# Patient Record
Sex: Male | Born: 1990
Health system: Southern US, Community
[De-identification: ages and names within clinical notes are randomized; demographics above are authoritative.]

---

## 2020-03-16 ENCOUNTER — Other Ambulatory Visit: Payer: Self-pay

## 2020-03-16 ENCOUNTER — Emergency Department (HOSPITAL_BASED_OUTPATIENT_CLINIC_OR_DEPARTMENT_OTHER)
Admission: EM | Admit: 2020-03-16 | Discharge: 2020-03-16 | Discharge status: Against Medical Advice | Payer: Self-pay | Attending: Emergency Medicine | Admitting: Emergency Medicine

## 2020-03-16 ENCOUNTER — Other Ambulatory Visit (HOSPITAL_BASED_OUTPATIENT_CLINIC_OR_DEPARTMENT_OTHER): Payer: Self-pay | Admitting: Medical

## 2020-03-16 ENCOUNTER — Encounter (HOSPITAL_BASED_OUTPATIENT_CLINIC_OR_DEPARTMENT_OTHER): Payer: Self-pay | Admitting: *Deleted

## 2020-03-16 ENCOUNTER — Emergency Department (HOSPITAL_BASED_OUTPATIENT_CLINIC_OR_DEPARTMENT_OTHER): Payer: Self-pay

## 2020-03-16 DIAGNOSIS — F17200 Nicotine dependence, unspecified, uncomplicated: Secondary | ICD-10-CM | POA: Insufficient documentation

## 2020-03-16 DIAGNOSIS — S50312A Abrasion of left elbow, initial encounter: Secondary | ICD-10-CM | POA: Insufficient documentation

## 2020-03-16 DIAGNOSIS — S60511A Abrasion of right hand, initial encounter: Secondary | ICD-10-CM | POA: Insufficient documentation

## 2020-03-16 DIAGNOSIS — M25522 Pain in left elbow: Secondary | ICD-10-CM

## 2020-03-16 MED ORDER — DOXYCYCLINE HYCLATE 100 MG PO CAPS: 100.0000 mg | ORAL_CAPSULE | Freq: Two times a day (BID) | ORAL | 0 refills | Status: DC

## 2020-03-16 MED ORDER — DOXYCYCLINE HYCLATE 100 MG PO TABS
100.0000 mg | ORAL_TABLET | Freq: Once | ORAL | Status: AC
  Administered 2020-03-16: 100 mg via ORAL
  Filled 2020-03-16: qty 1
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
## 2020-03-16 NOTE — ED Notes (Signed)
Pt walked out of department  Registration reported that pt was leaving
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
## 2020-03-16 NOTE — Discharge Instructions (Addendum)
At this time there does not appear to be the presence of an emergent medical condition, however there is always the potential for conditions to change. Please read and follow the below instructions.  Please return to the Emergency Department immediately for any new or worsening symptoms. Please be sure to follow up with your Primary Care Provider within one week regarding your visit today; please call their office to schedule an appointment even if you are feeling better for a follow-up visit. Please take your antibiotic Doxycycline as prescribed until complete to help with your likely infection of your right hand.  Please drink enough water to avoid dehydration and get plenty of rest.  Please have that rechecked by your primary care provider in the next 3 days.  Go to the nearest Emergency Department immediately if: You have fever or chills You have a red streak going away from your wound. You have a fever. You have fluid, blood, or pus coming from your wound. There is a bad smell coming from your wound or bandage. You have pain with movement of your hand or fingers. You have any new/concerning or worsening of symptoms.   Please read the additional information packets attached to your discharge summary.  Do not take your medicine if  develop an itchy rash, swelling in your mouth or lips, or difficulty breathing; call 911 and seek immediate emergency medical attention if this occurs.  You may review your lab tests and imaging results in their entirety on your MyChart account.  Please discuss all results of fully with your primary care provider and other specialist at your follow-up visit.  Note: Portions of this text may have been transcribed using voice recognition software. Every effort was made to ensure accuracy; however, inadvertent computerized transcription errors may still be present.

## 2020-03-17 MED FILL — DOXYCYCLINE HYCLATE 100 MG: 100 | 7 days supply | Qty: 14 | Fill #0

## 2022-08-25 IMAGING — DX DG ELBOW COMPLETE 3+V*L*
4 series · 4 of 4 positions shown · non-contrast
Comparison: None.

CLINICAL DATA: Concern of foreign body

EXAM:
LEFT ELBOW - COMPLETE 3+ VIEW

[elbow ap]
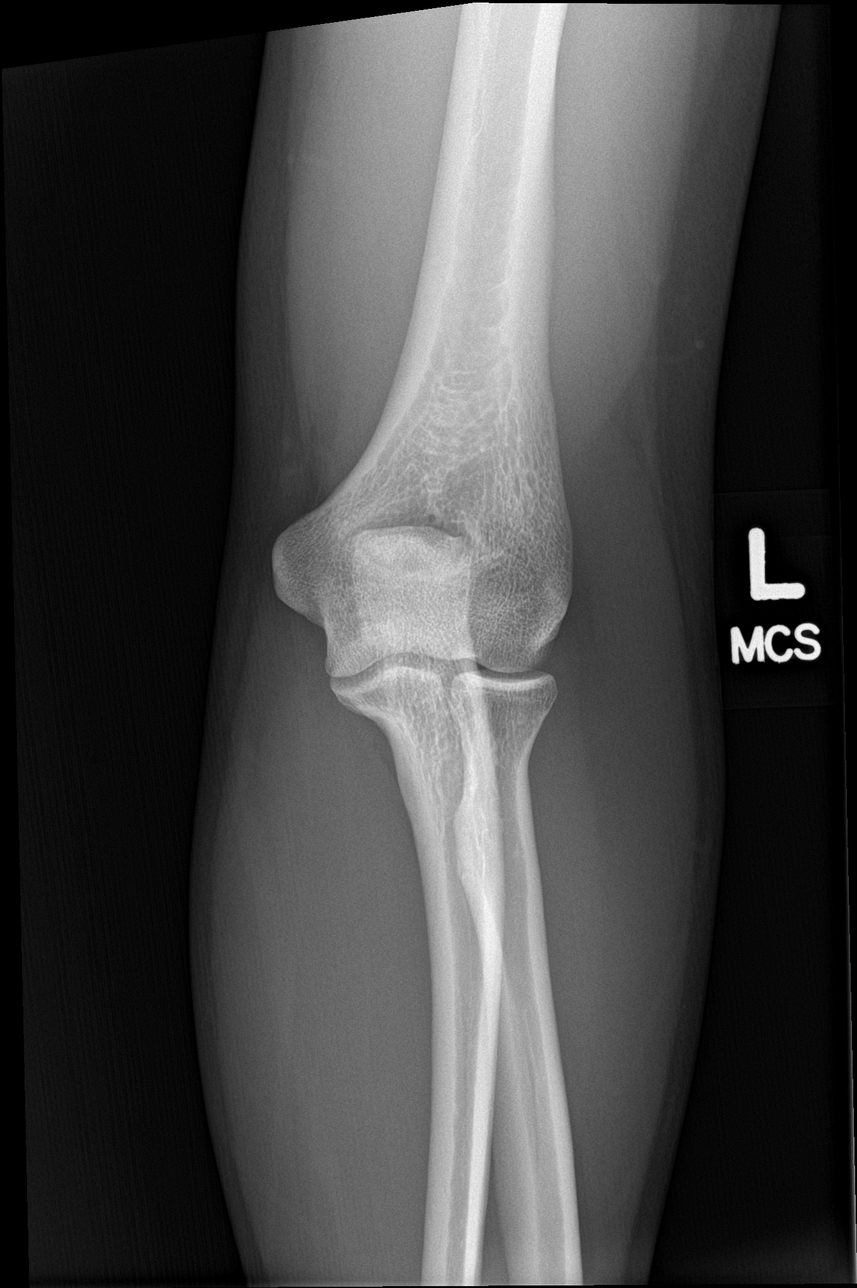

[elbow obl (1 of 2)]
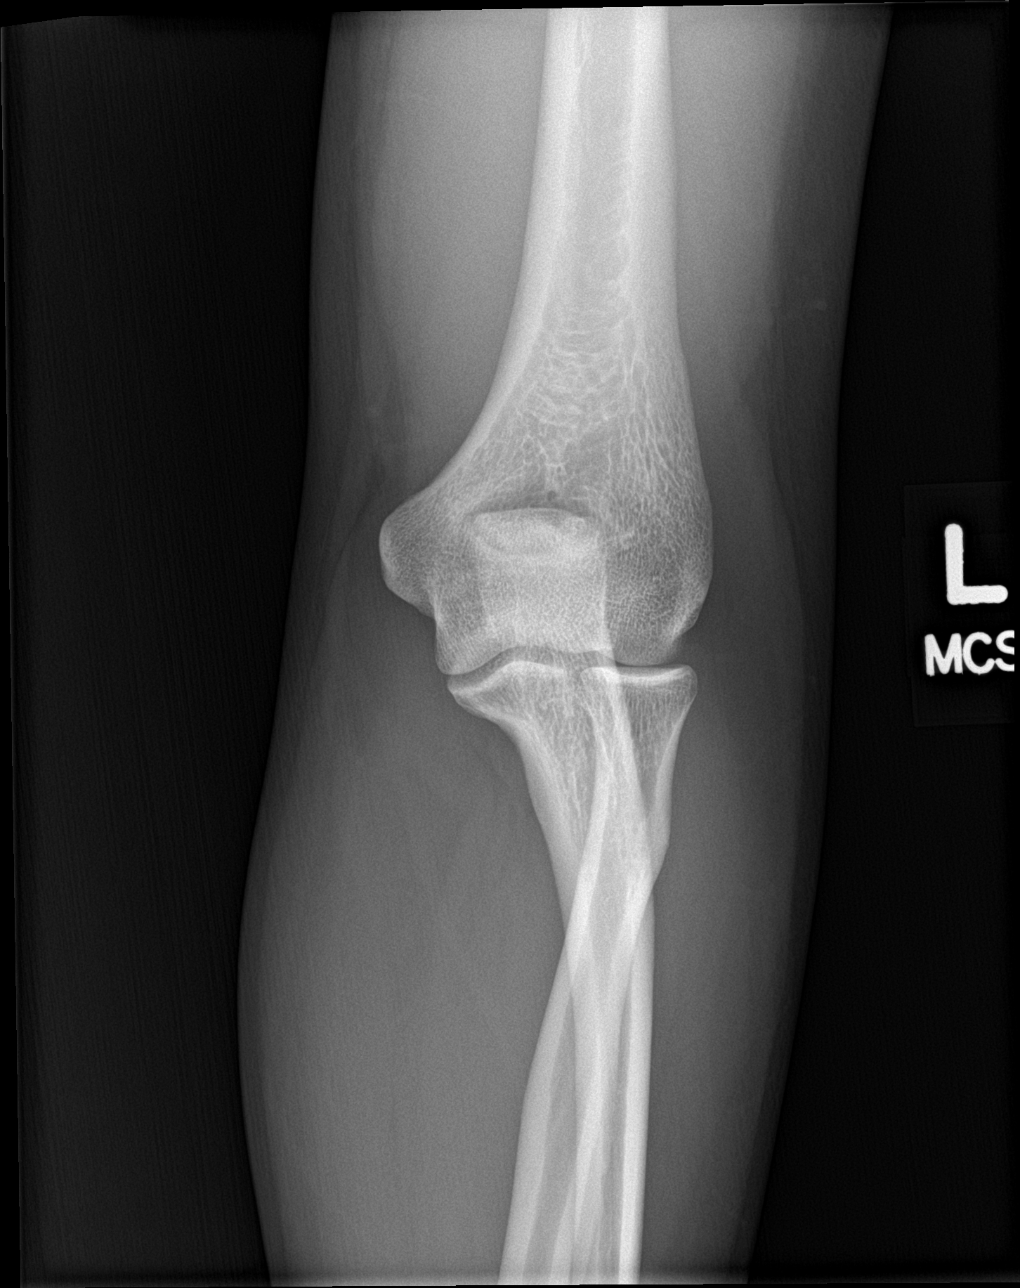

[elbow obl (2 of 2)]
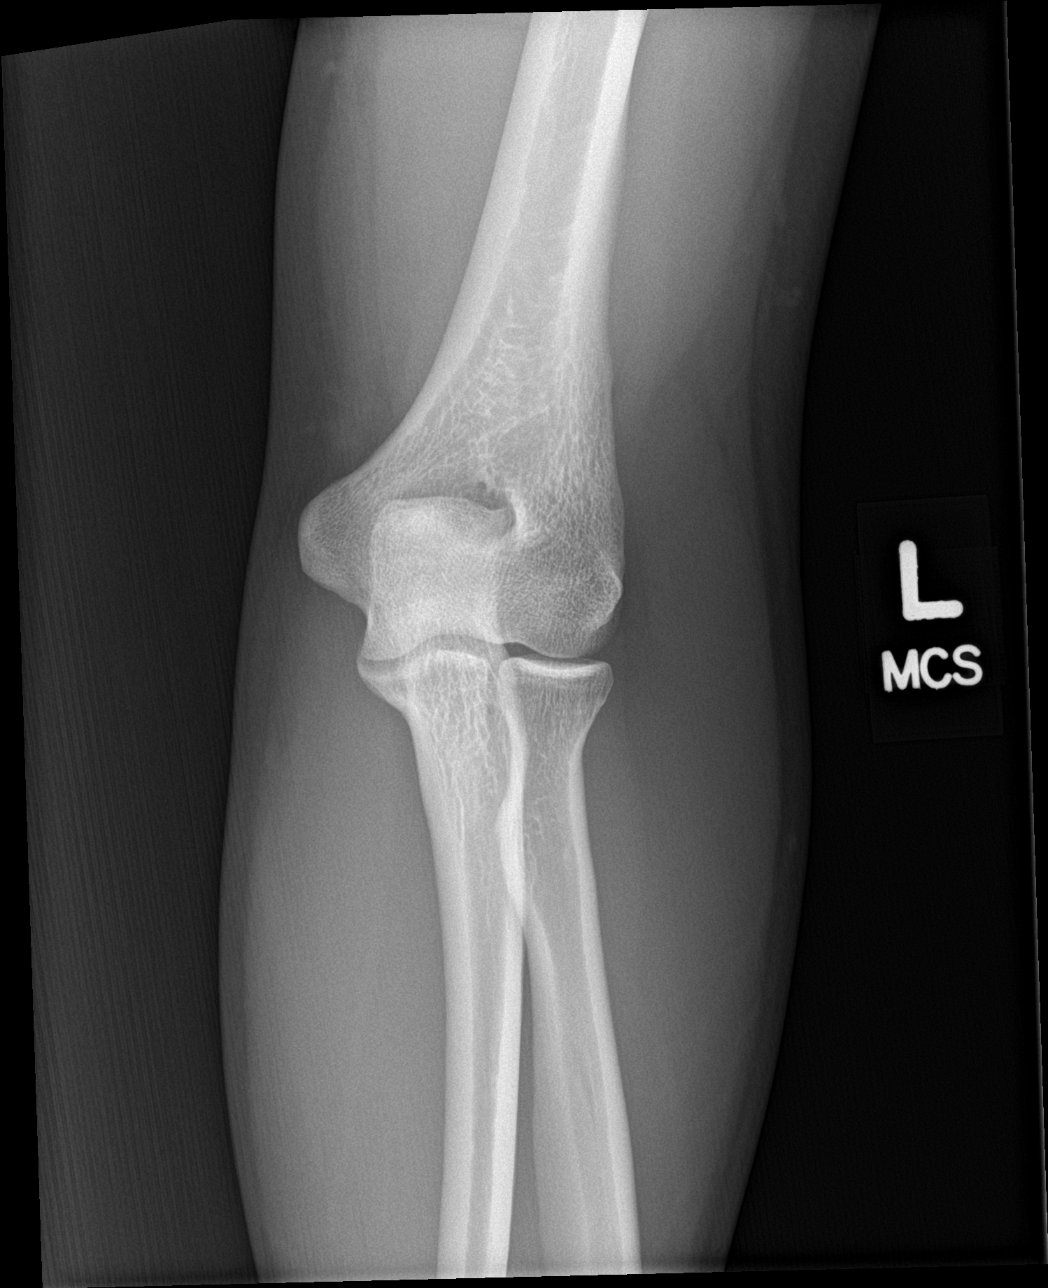

[elbow lat]
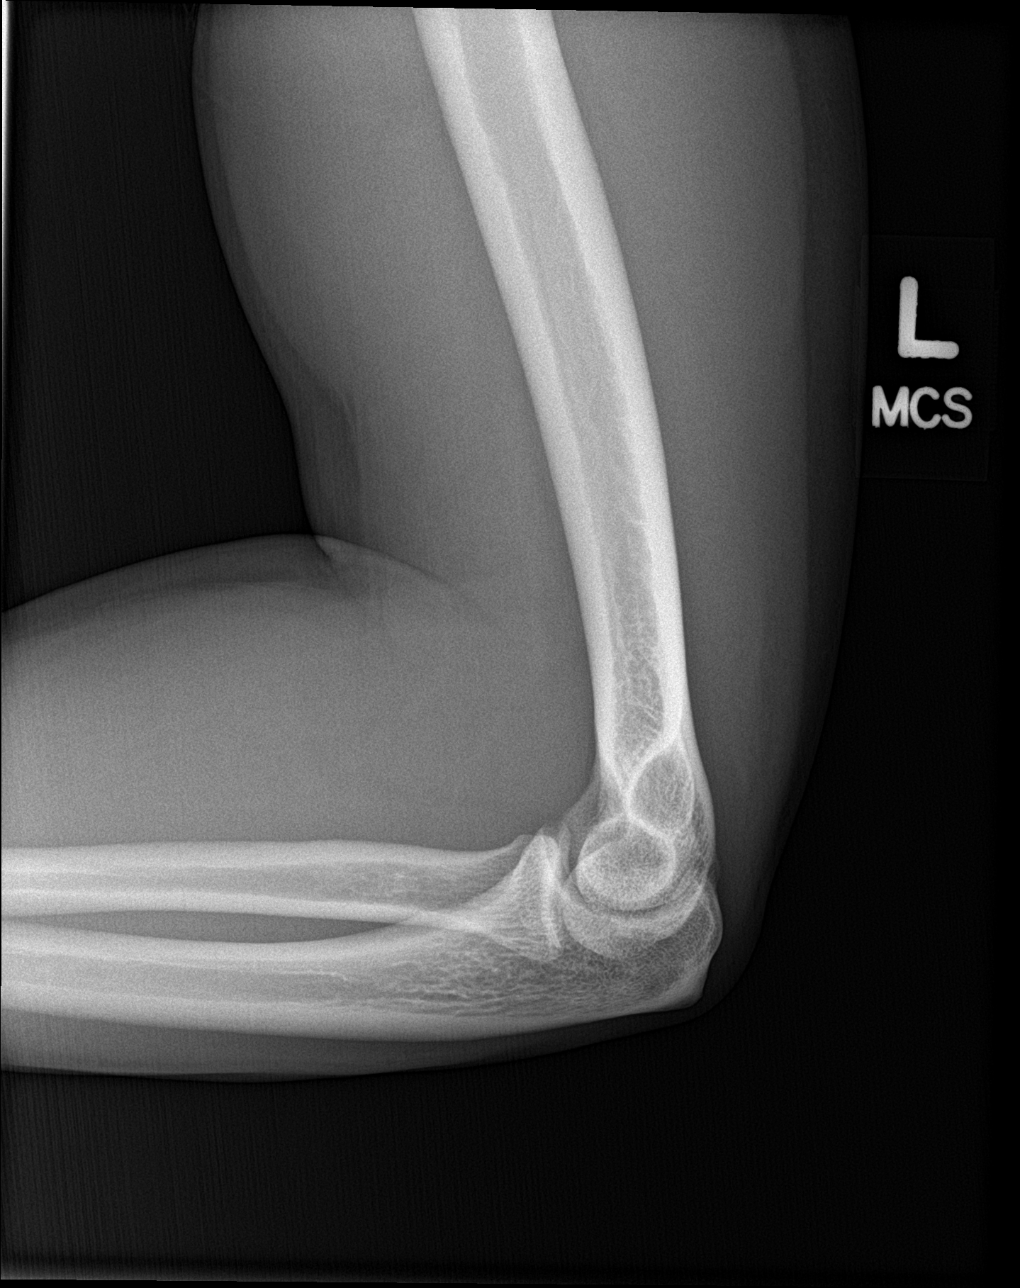

[4 of 4 positions shown; findings below may reference images not displayed]

FINDINGS: There is anatomic alignment. No acute fracture. Joint space is
preserved. There is no joint effusion. No radiopaque foreign body
IMPRESSION: Negative.

## 2023-11-10 ENCOUNTER — Other Ambulatory Visit (HOSPITAL_COMMUNITY): Payer: Self-pay
